# Patient Record
Sex: Male | Born: 1975 | Race: White | Hispanic: No | Marital: Married | Smoking: Former smoker
Health system: Southern US, Community
[De-identification: ages and names within clinical notes are randomized; demographics above are authoritative.]

---

## 2005-02-25 ENCOUNTER — Emergency Department: Payer: Self-pay | Admitting: Internal Medicine

## 2006-02-13 ENCOUNTER — Emergency Department: Payer: Self-pay | Admitting: Emergency Medicine

## 2006-03-18 ENCOUNTER — Emergency Department: Payer: Self-pay | Admitting: Emergency Medicine

## 2008-03-20 ENCOUNTER — Emergency Department: Payer: Self-pay | Admitting: Emergency Medicine

## 2010-03-13 ENCOUNTER — Emergency Department: Payer: Self-pay | Admitting: Emergency Medicine

## 2012-08-31 ENCOUNTER — Emergency Department: Payer: Self-pay | Admitting: Emergency Medicine

## 2012-08-31 LAB — CBC
HCT: 44.8 % (ref 40.0–52.0)
HGB: 15.7 g/dL (ref 13.0–18.0)
MCH: 31.5 pg (ref 26.0–34.0)
MCHC: 35 g/dL (ref 32.0–36.0)
MCV: 90 fL (ref 80–100)
Platelet: 252 10*3/uL (ref 150–440)
WBC: 11.8 10*3/uL — ABNORMAL HIGH (ref 3.8–10.6)

## 2012-08-31 LAB — BASIC METABOLIC PANEL
Anion Gap: 5 — ABNORMAL LOW (ref 7–16)
BUN: 11 mg/dL (ref 7–18)
Chloride: 108 mmol/L — ABNORMAL HIGH (ref 98–107)
Co2: 26 mmol/L (ref 21–32)
Creatinine: 0.85 mg/dL (ref 0.60–1.30)
EGFR (African American): >60
Glucose: 104 mg/dL — ABNORMAL HIGH (ref 65–99)
Osmolality: 277 (ref 275–301)

## 2012-08-31 LAB — TROPONIN I: Troponin-I: <0.02 ng/mL

## 2015-08-28 ENCOUNTER — Emergency Department
Admission: EM | Admit: 2015-08-28 | Discharge: 2015-08-28 | Disposition: A | Discharge status: Home / Self Care | Payer: Self-pay | Attending: Emergency Medicine | Admitting: Emergency Medicine

## 2015-08-28 ENCOUNTER — Encounter: Payer: Self-pay | Admitting: Emergency Medicine

## 2015-08-28 ENCOUNTER — Emergency Department: Payer: Self-pay

## 2015-08-28 DIAGNOSIS — M25572 Pain in left ankle and joints of left foot: Secondary | ICD-10-CM | POA: Insufficient documentation

## 2015-08-28 DIAGNOSIS — M79672 Pain in left foot: Secondary | ICD-10-CM

## 2015-08-28 LAB — SEDIMENTATION RATE: SED RATE: 8 mm/h (ref 0–15)

## 2015-08-28 LAB — URIC ACID: Uric Acid, Serum: 4.9 mg/dL (ref 4.4–7.6)

## 2015-08-28 MED ORDER — TRAMADOL HCL 50 MG PO TABS: 50.0000 mg | ORAL_TABLET | Freq: Four times a day (QID) | ORAL | Status: AC | PRN

## 2015-08-28 MED ORDER — NAPROXEN 500 MG PO TABS: 500.0000 mg | ORAL_TABLET | Freq: Two times a day (BID) | ORAL | Status: DC

## 2015-08-28 MED ORDER — NAPROXEN 500 MG PO TABS
500.0000 mg | ORAL_TABLET | Freq: Once | ORAL | Status: AC
  Administered 2015-08-28: 500 mg via ORAL
  Filled 2015-08-28: qty 1

## 2015-08-28 MED ORDER — TRAMADOL HCL 50 MG PO TABS
50.0000 mg | ORAL_TABLET | Freq: Once | ORAL | Status: AC
  Administered 2015-08-28: 50 mg via ORAL
  Filled 2015-08-28: qty 1

## 2015-08-28 NOTE — ED Notes (Signed)
States he developed pain to left ankle about 1 month ago  Denies any injury  PA in room on arrival

## 2015-08-28 NOTE — ED Notes (Signed)
Patient presents to the ED with left ankle pain x 1 month.  Patient is in no obvious distress at this time.

## 2015-08-28 NOTE — Discharge Instructions (Signed)

## 2015-08-28 NOTE — ED Provider Notes (Signed)
[facility] Emergency Department Provider Note   ____________________________________________  Time seen: Approximately 5:23 PM  I have reviewed the triage vital signs and the nursing notes.   HISTORY  Chief Complaint Ankle Pain    HPI Leonard Clarke is a 40 y.o. male patient complaining one half months of left foot pain that radiates to the lateral ankle. Patient denies any provocative incident except for prolonged standing and walking required by his job. Patient state the pain is increased and is not relieved with palliative measures of anti-inflammatory medications. Patient is rating the pain as a 10 over 10. Patient described a pain as a constant "achy".   History reviewed. No pertinent past medical history.  There are no active problems to display for this patient.   History reviewed. No pertinent past surgical history.  Current Outpatient Rx  Name  Route  Sig  Dispense  Refill  . naproxen (NAPROSYN) 500 MG tablet   Oral   Take 1 tablet (500 mg total) by mouth 2 (two) times daily with a meal.   20 tablet   0   . traMADol (ULTRAM) 50 MG tablet   Oral   Take 1 tablet (50 mg total) by mouth every 6 (six) hours as needed.   20 tablet   0     Allergies Review of patient's allergies indicates no known allergies.  No family history on file.  Social History Social History  Substance Use Topics  . Smoking status: Unknown If Ever Smoked  . Smokeless tobacco: None  . Alcohol Use: No    Review of Systems Constitutional: No fever/chills Eyes: No visual changes. ENT: No sore throat. Cardiovascular: Denies chest pain. Respiratory: Denies shortness of breath. Gastrointestinal: No abdominal pain.  No nausea, no vomiting.  No diarrhea.  No constipation. Genitourinary: Negative for dysuria. Musculoskeletal: Left foot and ankle pain  Skin: Negative for rash. Neurological: Negative for headaches, focal weakness or  numbness.   ____________________________________________   PHYSICAL EXAM:  VITAL SIGNS: ED Triage Vitals  Enc Vitals Group     BP 08/28/15 1648 117/84 mmHg     Pulse Rate 08/28/15 1648 86     Resp 08/28/15 1648 18     Temp 08/28/15 1648 98.1 F (36.7 C)     Temp Source 08/28/15 1648 Oral     SpO2 08/28/15 1648 96 %     Weight 08/28/15 1648 250 lb (113.399 kg)     Height 08/28/15 1648  (1.854 m)     Head Cir --      Peak Flow --      Pain Score 08/28/15 1648 10     Pain Loc --      Pain Edu? --      Excl. in GC? --     Constitutional: Alert and oriented. Well appearing and in no acute distress. Eyes: Conjunctivae are normal. PERRL. EOMI. Head: Atraumatic. Nose: No congestion/rhinnorhea. Mouth/Throat: Mucous membranes are moist.  Oropharynx non-erythematous. Neck: No stridor.  No cervical spine tenderness to palpation. Hematological/Lymphatic/Immunilogical: No cervical lymphadenopathy. Cardiovascular: Normal rate, regular rhythm. Grossly normal heart sounds.  Good peripheral circulation. Respiratory: Normal respiratory effort.  No retractions. Lungs CTAB. Gastrointestinal: Soft and nontender. No distention. No abdominal bruits. No CVA tenderness. Musculoskeletal: Obvious deformity, edema, or erythema to the left foot ankle. Patient has some moderate guarding palpation dose aspect of the left foot and leg lateral aspect of the ankle. Neurologic:  Normal speech and language. No gross focal  neurologic deficits are appreciated. No gait instability. Skin:  Skin is warm, dry and intact. No rash noted. Psychiatric: Mood and affect are normal. Speech and behavior are normal.  ____________________________________________   LABS (all labs ordered are listed, but only abnormal results are displayed)  Labs Reviewed  SEDIMENTATION RATE  URIC ACID   ____________________________________________  EKG   ____________________________________________  RADIOLOGY  No acute  findings on x-ray of the left ankle. ____________________________________________   PROCEDURES  Procedure(s) performed: None  Critical Care performed: No  ____________________________________________   INITIAL IMPRESSION / ASSESSMENT AND PLAN / ED COURSE  Pertinent labs & imaging results that were available during my care of the patient were reviewed by me and considered in my medical decision making (see chart for details).  Left foot and ankle pain. Discussed negative x-ray finding with patient. Discussed negative levers also patient. Patient was given a trial of naproxen and tramadol. Patient advised follow-up with podiatry if no improvement in 3-5 days. ____________________________________________   FINAL CLINICAL IMPRESSION(S) / ED DIAGNOSES  Final diagnoses:  Pain in left foot  Left lateral ankle pain      NEW MEDICATIONS STARTED DURING THIS VISIT:  New Prescriptions   NAPROXEN (NAPROSYN) 500 MG TABLET    Take 1 tablet (500 mg total) by mouth 2 (two) times daily with a meal.   TRAMADOL (ULTRAM) 50 MG TABLET    Take 1 tablet (50 mg total) by mouth every 6 (six) hours as needed.     Note:  This document was prepared using Dragon voice recognition software and may include unintentional dictation errors.    Joni ReiningRonald K Smith, PA-C 08/28/15 1933  Sharyn CreamerMark Quale, MD 08/29/15 [phone]

## 2017-03-11 IMAGING — DX DG FOOT COMPLETE 3+V*L*
3 series · 3 of 3 positions shown · non-contrast
Comparison: None.

CLINICAL DATA: Left ankle pain x1 month

EXAM:
LEFT FOOT - COMPLETE 3+ VIEW

[foot ap]
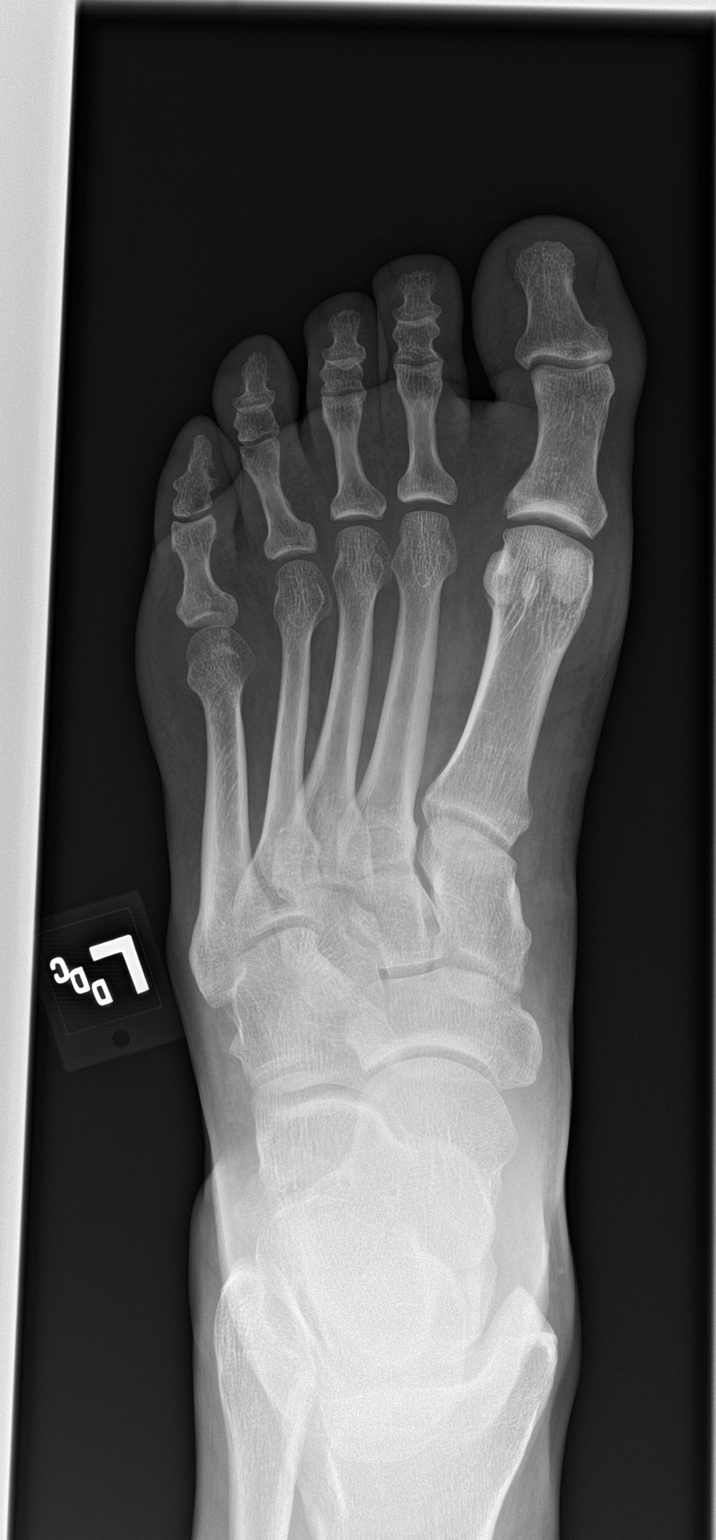

[foot obl]
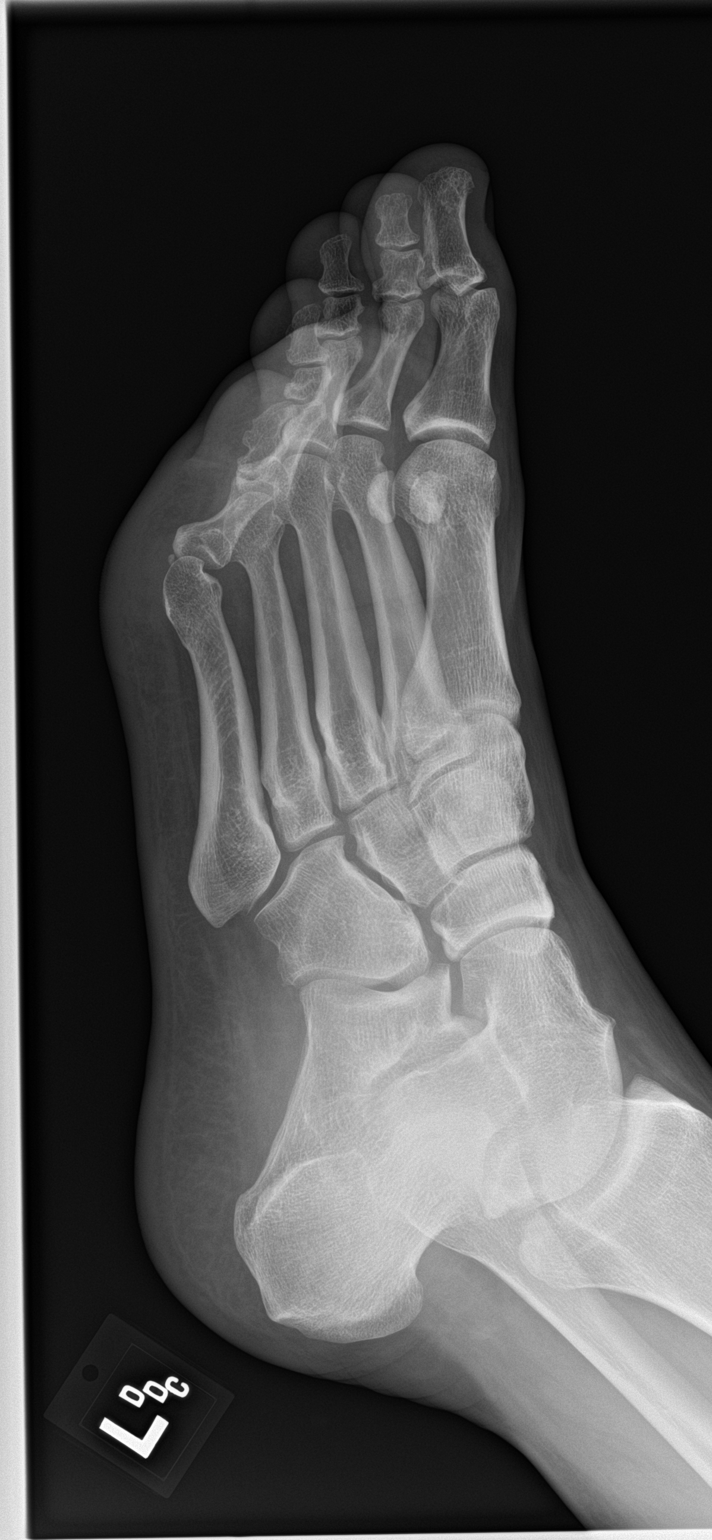

[foot lat]
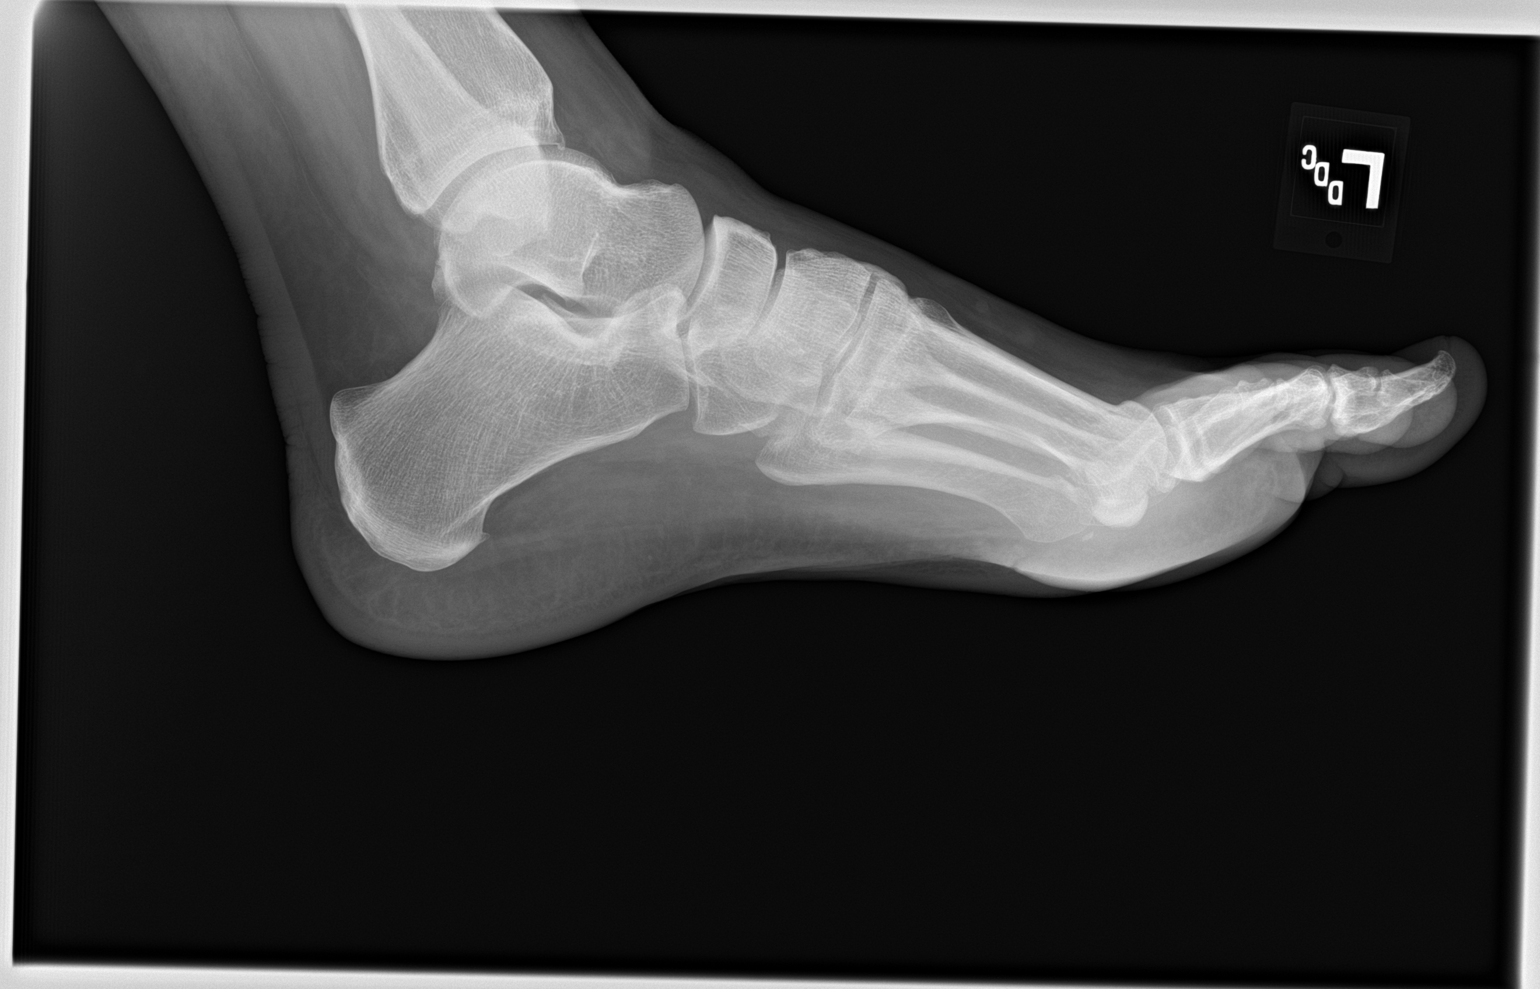

[3 of 3 positions shown; findings below may reference images not displayed]

FINDINGS: No fracture or dislocation is seen.

The joint spaces are preserved.

The visualized soft tissues are unremarkable.
IMPRESSION: No fracture or dislocation is seen.

## 2021-07-02 ENCOUNTER — Ambulatory Visit: Payer: Self-pay | Admitting: Internal Medicine

## 2021-07-02 ENCOUNTER — Encounter: Payer: Self-pay | Admitting: Internal Medicine

## 2021-07-02 VITALS — BP 118/62 | HR 93 | Temp 97.5°F | Resp 16 | Ht 73.0 in | Wt 234.6 lb

## 2021-07-02 DIAGNOSIS — F419 Anxiety disorder, unspecified: Secondary | ICD-10-CM

## 2021-07-02 DIAGNOSIS — Z1159 Encounter for screening for other viral diseases: Secondary | ICD-10-CM

## 2021-07-02 DIAGNOSIS — R911 Solitary pulmonary nodule: Secondary | ICD-10-CM

## 2021-07-02 DIAGNOSIS — Z114 Encounter for screening for human immunodeficiency virus [HIV]: Secondary | ICD-10-CM

## 2021-07-02 DIAGNOSIS — G8929 Other chronic pain: Secondary | ICD-10-CM

## 2021-07-02 DIAGNOSIS — Z13220 Encounter for screening for lipoid disorders: Secondary | ICD-10-CM

## 2021-07-02 DIAGNOSIS — R252 Cramp and spasm: Secondary | ICD-10-CM

## 2021-07-02 DIAGNOSIS — R200 Anesthesia of skin: Secondary | ICD-10-CM

## 2021-07-02 DIAGNOSIS — Z23 Encounter for immunization: Secondary | ICD-10-CM

## 2021-07-02 DIAGNOSIS — M546 Pain in thoracic spine: Secondary | ICD-10-CM

## 2021-07-02 DIAGNOSIS — R202 Paresthesia of skin: Secondary | ICD-10-CM

## 2021-07-02 MED ORDER — HYDROXYZINE HCL 10 MG PO TABS: 10.0000 mg | ORAL_TABLET | ORAL | 1 refills | Status: AC | PRN

## 2021-07-02 MED ORDER — BUPROPION HCL ER (XL) 150 MG PO TB24: 150.0000 mg | ORAL_TABLET | Freq: Every day | ORAL | 1 refills | Status: AC

## 2021-07-02 NOTE — Patient Instructions (Addendum)
It was great seeing you today! ? ?Plan discussed at today's visit: ?-Blood work ordered today, results will be uploaded to [facility].  ?-Back x-ray today, pending results we may get an MRI ?-Chest CT ordered  ?-Hydroxyzine as needed for anxiety ?-Will start low dose Wellbutrin  ?-Tdap vaccine given today as well  ? ?Follow up in: 6 weeks  ? ?Take care and let us know if you have any questions or concerns prior to your next visit. ? ?Dr. Rosana Berger ? ?

## 2021-07-02 NOTE — Progress Notes (Signed)
? ?New Patient Office Visit ? ?Subjective:  ?Patient ID: Leonard Clarke, male    DOB: [birth year]  Age: 46 y.o. MRN: [id number] ? ?CC:  ?Chief Complaint  ?Patient presents with  ? Establish Care  ? Anxiety  ?  Trouble sleeping, legs jumping/tightning  ? Labs Only  ? ? ?HPI ?Leonard Clarke presents as a new patient. He is also here with his significant other who helps provide history.  He has not seen a medical provider since he was young.  He does not have any diagnosed chronic medical conditions.  He does not take any daily medications.  He does have multiple chronic issues going on today. ? ?Anxiety: ?-Duration:uncontrolled ?-Anxious mood: yes  ?-Excessive worrying: yes ?-Irritability: yes  ?-Depressed mood: yes ?-Current Treatments: Nothing, never has been treated  ?-Counseling: No, is considering therapy through his church ? ? ?  07/02/2021  ? 11:35 AM  ?Depression screen PHQ 2/9  ?Decreased Interest 1  ?Down, Depressed, Hopeless 1  ?PHQ - 2 Score 2  ?Altered sleeping 1  ?Tired, decreased energy 1  ?Change in appetite 0  ?Feeling bad or failure about yourself  1  ?Trouble concentrating 3  ?Moving slowly or fidgety/restless 3  ?Suicidal thoughts 0  ?PHQ-9 Score 11  ?Difficult doing work/chores Very difficult  ? ?Muscle cramps: Describes muscle "tightening"/cramps or spasms of his bilateral lower extremity at night. This does not occur during the day. He also has cramps in his hands and wrists, which will turn inward and become very painful. This has been going on for years and is gradually getting worse. He has started a multivitamin which does help. He has noticed this occurring more if he is more active during the day. He does experience numbness and swelling of his feet and hands. He does endorse a red/purple discoloration on the back of his arms occasionally. This will come and go and is without specific triggers. ? ?Thoracic Back Pain: Chronic, at the level of T6-T7 more so on the right side. Worse with  palpation, twisting and certain positions.  Nothing appears to make this pain better.  He does have sciatica symptoms that starts at the level of T6-T7 and shoot down into his lumbar back and into his legs. He denies bladder/bowel incontinence or saddle anesthesia.  ? ?Right upper lobe lung nodule: Went to the ER in [facility] in March for chest pain.  Lung x-ray showed 1 cm nodule in the right upper lobe.  He is a current smoker and smokes about half a pack per day for the last 30 years.  He currently denies respiratory symptoms, no cough, wheezing, shortness of breath, hemoptysis. ? ?History reviewed. No pertinent past medical history. ? ?History reviewed. No pertinent surgical history. ? ?Family History  ?Problem Relation Age of Onset  ? Drug abuse Mother   ? Drug abuse Father   ? Suicidality Son   ? Huntington's disease Maternal Grandmother   ? ? ?Social History  ? ?Socioeconomic History  ? Marital status: Married  ?  Spouse name: Not on file  ? Number of children: Not on file  ? Years of education: Not on file  ? Highest education level: Not on file  ?Occupational History  ? Not on file  ?Tobacco Use  ? Smoking status: Every Day  ?  Packs/day: 0.50  ?  Types: Cigarettes  ? Smokeless tobacco: Not on file  ?Vaping Use  ? Vaping Use: Never used  ?Substance and Sexual Activity  ?  Alcohol use: No  ? Drug use: Never  ? Sexual activity: Yes  ?Other Topics Concern  ? Not on file  ?Social History Narrative  ? Not on file  ? ?Social Determinants of Health  ? ?Financial Resource Strain: Not on file  ?Food Insecurity: Not on file  ?Transportation Needs: Not on file  ?Physical Activity: Not on file  ?Stress: Not on file  ?Social Connections: Not on file  ?Intimate Partner Violence: Not on file  ? ? ?ROS ?Review of Systems  ?Constitutional:  Negative for chills and fever.  ?Respiratory:  Negative for cough, shortness of breath and wheezing.   ?Cardiovascular:  Negative for chest pain.  ?Gastrointestinal:  Negative for  abdominal pain, constipation, diarrhea and nausea.  ?Genitourinary:  Negative for dysuria and hematuria.  ?Musculoskeletal:  Positive for back pain.  ?Skin:  Positive for color change.  ?Neurological:  Positive for numbness. Negative for dizziness, seizures, syncope, weakness and headaches.  ?Psychiatric/Behavioral:  Positive for decreased concentration. The patient is nervous/anxious.   ? ?Objective:  ? ?Today's Vitals: BP 118/62   Pulse 93   Temp (!) 97.5 ?F (36.4 ?C)   Resp 16   Ht 6' 1"  (1.854 m)   Wt 234 lb 9.6 oz (106.4 kg)   SpO2 98%   BMI 30.95 kg/m?  ? ?Physical Exam ?Constitutional:   ?   Appearance: Normal appearance.  ?HENT:  ?   Head: Normocephalic and atraumatic.  ?   Mouth/Throat:  ?   Mouth: Mucous membranes are moist.  ?   Pharynx: Oropharynx is clear.  ?Eyes:  ?   Extraocular Movements: Extraocular movements intact.  ?   Conjunctiva/sclera: Conjunctivae normal.  ?   Pupils: Pupils are equal, round, and reactive to light.  ?Cardiovascular:  ?   Rate and Rhythm: Normal rate and regular rhythm.  ?Pulmonary:  ?   Effort: Pulmonary effort is normal.  ?   Breath sounds: Normal breath sounds. No wheezing, rhonchi or rales.  ?Abdominal:  ?   General: There is no distension.  ?   Palpations: Abdomen is soft.  ?   Tenderness: There is no abdominal tenderness. There is no right CVA tenderness, left CVA tenderness, guarding or rebound.  ?Musculoskeletal:  ?   Right lower leg: No edema.  ?   Left lower leg: No edema.  ?Skin: ?   General: Skin is warm and dry.  ?Neurological:  ?   General: No focal deficit present.  ?   Mental Status: He is alert. Mental status is at baseline.  ?Psychiatric:     ?   Mood and Affect: Mood normal.     ?   Behavior: Behavior normal.  ? ? ?Assessment & Plan:  ? ?1. Anxiety: Discussed counseling, which he will be doing through his church. Worried about sexual effects, will start treatment with Wellbutrin 150 mg daily and hydroxyzine as needed for anxiety.  Discussed potential  side effects of these medications.  Plan to recheck in 6 weeks. ? ?- buPROPion (WELLBUTRIN XL) 150 MG 24 hr tablet; Take 1 tablet (150 mg total) by mouth daily.  Dispense: 30 tablet; Refill: 1 ?- hydrOXYzine (ATARAX) 10 MG tablet; Take 1 tablet (10 mg total) by mouth as needed for anxiety.  Dispense: 30 tablet; Refill: 1 ? ?2. Muscle cramps/Numbness and tingling in both hands: Uncertain etiology, will obtain screening labs including autoimmune and inflammatory screening.  The patient does have a family history of Huntington's disease on his maternal grandmother side.  Discussed pathology of Huntington's disease and prognosis.  At this point the patient does not want to pursue genetic testing or further work-up for this. ? ?- CBC w/Diff/Platelet ?- COMPLETE METABOLIC PANEL WITH GFR ?- Antinuclear Antib (ANA) ?- Sed Rate (ESR) ?- C-reactive protein ? ?3. Chronic right-sided thoracic back pain: We will start work-up with a thoracic x-ray today, however may require MRI. ? ?- DG Thoracic Spine 2 View; Future ? ?4. Solitary pulmonary nodule/Lung nodule seen on imaging study: Chest x-ray from 05/28/2021 showing right upper lobe ill-defined 1 cm nodule ? ?- CT Chest Wo Contrast; Future ? ?5. Screening for HIV without presence of risk factors ? ?- HIV antibody (with reflex) ? ?6. Encounter for hepatitis C screening test for low risk patient ? ?- Hepatitis C Antibody ? ?7. Lipid screening ? ?- Lipid Profile ? ?8. Need for Tdap vaccination ? ?- Tdap vaccine greater than or equal to 7yo IM ? ? ?Follow-up: Return in about 6 weeks (around 08/13/2021).  ? ?Teodora Medici, DO ? ?

## 2021-07-05 LAB — LIPID PANEL
Cholesterol: 138 mg/dL (ref <200)
HDL: 39 mg/dL — ABNORMAL LOW (ref >=40)
LDL Cholesterol (Calc): 82 mg/dL (calc)
Non-HDL Cholesterol (Calc): 99 mg/dL (calc) (ref <130)
Total CHOL/HDL Ratio: 3.5 (calc) (ref <5.0)
Triglycerides: 88 mg/dL (ref <150)

## 2021-07-05 LAB — HEPATITIS C ANTIBODY
Hepatitis C Ab: NONREACTIVE
SIGNAL TO CUT-OFF: 0.07 (ref <1.00)

## 2021-07-05 LAB — CBC WITH DIFFERENTIAL/PLATELET
Absolute Monocytes: 783 cells/uL (ref 200–950)
Basophils Absolute: 52 cells/uL (ref 0–200)
Basophils Relative: 0.6 %
Eosinophils Absolute: 200 cells/uL (ref 15–500)
Eosinophils Relative: 2.3 %
HCT: 40.5 % (ref 38.5–50.0)
Hemoglobin: 13.5 g/dL (ref 13.2–17.1)
Lymphs Abs: 2419 cells/uL (ref 850–3900)
MCH: 29.8 pg (ref 27.0–33.0)
MCHC: 33.3 g/dL (ref 32.0–36.0)
MCV: 89.4 fL (ref 80.0–100.0)
MPV: 10.1 fL (ref 7.5–12.5)
Monocytes Relative: 9 %
Neutro Abs: 5246 cells/uL (ref 1500–7800)
Neutrophils Relative %: 60.3 %
Platelets: 302 10*3/uL (ref 140–400)
RBC: 4.53 10*6/uL (ref 4.20–5.80)
RDW: 13.4 % (ref 11.0–15.0)
Total Lymphocyte: 27.8 %
WBC: 8.7 10*3/uL (ref 3.8–10.8)

## 2021-07-05 LAB — COMPLETE METABOLIC PANEL WITH GFR
AG Ratio: 1.2 (calc) (ref 1.0–2.5)
ALT: 21 U/L (ref 9–46)
AST: 17 U/L (ref 10–40)
Albumin: 3.7 g/dL (ref 3.6–5.1)
Alkaline phosphatase (APISO): 77 U/L (ref 36–130)
BUN: 15 mg/dL (ref 7–25)
CO2: 26 mmol/L (ref 20–32)
Calcium: 8.9 mg/dL (ref 8.6–10.3)
Chloride: 107 mmol/L (ref 98–110)
Creat: 0.87 mg/dL (ref 0.60–1.29)
Globulin: 3 g/dL (calc) (ref 1.9–3.7)
Glucose, Bld: 89 mg/dL (ref 65–99)
Potassium: 5 mmol/L (ref 3.5–5.3)
Sodium: 140 mmol/L (ref 135–146)
Total Bilirubin: 0.3 mg/dL (ref 0.2–1.2)
Total Protein: 6.7 g/dL (ref 6.1–8.1)
eGFR: 110 mL/min/{1.73_m2} (ref >=60)

## 2021-07-05 LAB — HIV ANTIBODY (ROUTINE TESTING W REFLEX): HIV 1&2 Ab, 4th Generation: NONREACTIVE

## 2021-07-05 LAB — ANA: Anti Nuclear Antibody (ANA): NEGATIVE

## 2021-07-05 LAB — SEDIMENTATION RATE: Sed Rate: 9 mm/h (ref 0–15)

## 2021-07-05 LAB — C-REACTIVE PROTEIN: CRP: 6 mg/L (ref <8.0)

## 2021-07-10 ENCOUNTER — Ambulatory Visit: Payer: Self-pay | Attending: Internal Medicine

## 2021-08-15 ENCOUNTER — Ambulatory Visit: Payer: Medicaid Other | Admitting: Internal Medicine

## 2021-08-15 NOTE — Progress Notes (Deleted)
New Patient Office Visit  Subjective:  Patient ID: Leonard Clarke, male    DOB: 01-Jun-1975  Age: 46 y.o. MRN: [id number]  CC:  No chief complaint on file.   HPI Leonard Clarke presents for follow up.   Anxiety: -Duration:uncontrolled -Anxious mood: yes  -Excessive worrying: yes -Irritability: yes  -Depressed mood: yes -Current Treatments: At LOV started Wellbutrin 150 mg daily, Hydroxyzine PRN -Counseling: No, is considering therapy through his church     07/02/2021   11:35 AM  Depression screen PHQ 2/9  Decreased Interest 1  Down, Depressed, Hopeless 1  PHQ - 2 Score 2  Altered sleeping 1  Tired, decreased energy 1  Change in appetite 0  Feeling bad or failure about yourself  1  Trouble concentrating 3  Moving slowly or fidgety/restless 3  Suicidal thoughts 0  PHQ-9 Score 11  Difficult doing work/chores Very difficult   Muscle cramps: Describes muscle "tightening"/cramps or spasms of his bilateral lower extremity at night. This does not occur during the day. He also has cramps in his hands and wrists, which will turn inward and become very painful. This has been going on for years and is gradually getting worse. He has started a multivitamin which does help. He has noticed this occurring more if he is more active during the day. He does experience numbness and swelling of his feet and hands. He does endorse a red/purple discoloration on the back of his arms occasionally. This will come and go and is without specific triggers.  Thoracic Back Pain: Chronic, at the level of T6-T7 more so on the right side. Worse with palpation, twisting and certain positions.  Nothing appears to make this pain better.  He does have sciatica symptoms that starts at the level of T6-T7 and shoot down into his lumbar back and into his legs. He denies bladder/bowel incontinence or saddle anesthesia.   Ordered x-ray of the thoracic spine which was never obtained.   Right upper lobe lung nodule:  Went to the ER in [facility] in March for chest pain.  Lung x-ray showed 1 cm nodule in the right upper lobe.  He is a current smoker and smokes about half a pack per day for the last 30 years.  He currently denies respiratory symptoms, no cough, wheezing, shortness of breath, hemoptysis.  CT chest w/o contrast ordered, not done.   No past medical history on file.  No past surgical history on file.  Family History  Problem Relation Age of Onset   Drug abuse Mother    Drug abuse Father    Suicidality Son    Huntington's disease Maternal Grandmother     Social History   Socioeconomic History   Marital status: Married    Spouse name: Not on file   Number of children: Not on file   Years of education: Not on file   Highest education level: Not on file  Occupational History   Not on file  Tobacco Use   Smoking status: Every Day    Packs/day: 0.50    Types: Cigarettes   Smokeless tobacco: Not on file  Vaping Use   Vaping Use: Never used  Substance and Sexual Activity   Alcohol use: No   Drug use: Never   Sexual activity: Yes  Other Topics Concern   Not on file  Social History Narrative   Not on file   Social Determinants of Health   Financial Resource Strain: Not on file  Food Insecurity:  Not on file  Transportation Needs: Not on file  Physical Activity: Not on file  Stress: Not on file  Social Connections: Not on file  Intimate Partner Violence: Not on file    ROS Review of Systems  Constitutional:  Negative for chills and fever.  Respiratory:  Negative for cough, shortness of breath and wheezing.   Cardiovascular:  Negative for chest pain.  Gastrointestinal:  Negative for abdominal pain, constipation, diarrhea and nausea.  Genitourinary:  Negative for dysuria and hematuria.  Musculoskeletal:  Positive for back pain.  Skin:  Positive for color change.  Neurological:  Positive for numbness. Negative for dizziness, seizures, syncope, weakness and headaches.   Psychiatric/Behavioral:  Positive for decreased concentration. The patient is nervous/anxious.    Objective:   Today's Vitals: There were no vitals taken for this visit.  Physical Exam Constitutional:      Appearance: Normal appearance.  HENT:     Head: Normocephalic and atraumatic.     Mouth/Throat:     Mouth: Mucous membranes are moist.     Pharynx: Oropharynx is clear.  Eyes:     Extraocular Movements: Extraocular movements intact.     Conjunctiva/sclera: Conjunctivae normal.     Pupils: Pupils are equal, round, and reactive to light.  Cardiovascular:     Rate and Rhythm: Normal rate and regular rhythm.  Pulmonary:     Effort: Pulmonary effort is normal.     Breath sounds: Normal breath sounds. No wheezing, rhonchi or rales.  Abdominal:     General: There is no distension.     Palpations: Abdomen is soft.     Tenderness: There is no abdominal tenderness. There is no right CVA tenderness, left CVA tenderness, guarding or rebound.  Musculoskeletal:     Right lower leg: No edema.     Left lower leg: No edema.  Skin:    General: Skin is warm and dry.  Neurological:     General: No focal deficit present.     Mental Status: He is alert. Mental status is at baseline.  Psychiatric:        Mood and Affect: Mood normal.        Behavior: Behavior normal.    Assessment & Plan:   1. Anxiety: Discussed counseling, which he will be doing through his church. Worried about sexual effects, will start treatment with Wellbutrin 150 mg daily and hydroxyzine as needed for anxiety.  Discussed potential side effects of these medications.  Plan to recheck in 6 weeks.  - buPROPion (WELLBUTRIN XL) 150 MG 24 hr tablet; Take 1 tablet (150 mg total) by mouth daily.  Dispense: 30 tablet; Refill: 1 - hydrOXYzine (ATARAX) 10 MG tablet; Take 1 tablet (10 mg total) by mouth as needed for anxiety.  Dispense: 30 tablet; Refill: 1  2. Muscle cramps/Numbness and tingling in both hands: Uncertain  etiology, will obtain screening labs including autoimmune and inflammatory screening.  The patient does have a family history of Huntington's disease on his maternal grandmother side.  Discussed pathology of Huntington's disease and prognosis.  At this point the patient does not want to pursue genetic testing or further work-up for this.  - CBC w/Diff/Platelet - COMPLETE METABOLIC PANEL WITH GFR - Antinuclear Antib (ANA) - Sed Rate (ESR) - C-reactive protein  3. Chronic right-sided thoracic back pain: We will start work-up with a thoracic x-ray today, however may require MRI.  - DG Thoracic Spine 2 View; Future  4. Solitary pulmonary nodule/Lung nodule seen on imaging  study: Chest x-ray from 05/28/2021 showing right upper lobe ill-defined 1 cm nodule  - CT Chest Wo Contrast; Future  5. Screening for HIV without presence of risk factors  - HIV antibody (with reflex)  6. Encounter for hepatitis C screening test for low risk patient  - Hepatitis C Antibody  7. Lipid screening  - Lipid Profile  8. Need for Tdap vaccination  - Tdap vaccine greater than or equal to 7yo IM   Follow-up: No follow-ups on file.   Teodora Medici, DO

## 2022-03-28 ENCOUNTER — Emergency Department (HOSPITAL_COMMUNITY): Payer: Medicaid Other

## 2022-03-28 ENCOUNTER — Emergency Department (HOSPITAL_COMMUNITY)
Admission: EM | Admit: 2022-03-28 | Discharge: 2022-03-28 | Disposition: A | Discharge status: Home / Self Care | Payer: Medicaid Other | Attending: Emergency Medicine | Admitting: Emergency Medicine

## 2022-03-28 ENCOUNTER — Encounter (HOSPITAL_COMMUNITY): Payer: Self-pay

## 2022-03-28 DIAGNOSIS — U071 COVID-19: Secondary | ICD-10-CM | POA: Insufficient documentation

## 2022-03-28 DIAGNOSIS — R0602 Shortness of breath: Secondary | ICD-10-CM | POA: Diagnosis not present

## 2022-03-28 DIAGNOSIS — R079 Chest pain, unspecified: Secondary | ICD-10-CM | POA: Diagnosis not present

## 2022-03-28 DIAGNOSIS — R059 Cough, unspecified: Secondary | ICD-10-CM | POA: Diagnosis not present

## 2022-03-28 DIAGNOSIS — J189 Pneumonia, unspecified organism: Secondary | ICD-10-CM

## 2022-03-28 DIAGNOSIS — R0789 Other chest pain: Secondary | ICD-10-CM | POA: Diagnosis not present

## 2022-03-28 DIAGNOSIS — J1282 Pneumonia due to coronavirus disease 2019: Secondary | ICD-10-CM | POA: Diagnosis not present

## 2022-03-28 DIAGNOSIS — J168 Pneumonia due to other specified infectious organisms: Secondary | ICD-10-CM | POA: Insufficient documentation

## 2022-03-28 DIAGNOSIS — M940 Chondrocostal junction syndrome [Tietze]: Secondary | ICD-10-CM | POA: Diagnosis not present

## 2022-03-28 DIAGNOSIS — R509 Fever, unspecified: Secondary | ICD-10-CM | POA: Diagnosis not present

## 2022-03-28 DIAGNOSIS — R000 Tachycardia, unspecified: Secondary | ICD-10-CM | POA: Insufficient documentation

## 2022-03-28 DIAGNOSIS — G4489 Other headache syndrome: Secondary | ICD-10-CM | POA: Diagnosis not present

## 2022-03-28 MED ORDER — AZITHROMYCIN 250 MG PO TABS: 250.0000 mg | ORAL_TABLET | Freq: Every day | ORAL | 0 refills | Status: AC

## 2022-03-28 MED ORDER — LIDOCAINE 4 % EX PTCH: 1.0000 | MEDICATED_PATCH | CUTANEOUS | 0 refills | Status: DC

## 2022-03-28 MED ORDER — LIDOCAINE 4 % EX PTCH: 1.0000 | MEDICATED_PATCH | CUTANEOUS | 0 refills | Status: AC

## 2022-03-28 MED ORDER — ALBUTEROL SULFATE HFA 108 (90 BASE) MCG/ACT IN AERS: 1.0000 | INHALATION_SPRAY | Freq: Four times a day (QID) | RESPIRATORY_TRACT | 0 refills | Status: AC | PRN

## 2022-03-28 MED ORDER — AZITHROMYCIN 250 MG PO TABS: 250.0000 mg | ORAL_TABLET | Freq: Every day | ORAL | 0 refills | Status: DC

## 2022-03-28 MED ORDER — AMOXICILLIN 500 MG PO CAPS: 1000.0000 mg | ORAL_CAPSULE | Freq: Three times a day (TID) | ORAL | 0 refills | Status: AC

## 2022-03-28 MED ORDER — IBUPROFEN 800 MG PO TABS
800.0000 mg | ORAL_TABLET | Freq: Once | ORAL | Status: AC
  Administered 2022-03-28: 800 mg via ORAL
  Filled 2022-03-28: qty 1

## 2022-03-28 MED ORDER — AMOXICILLIN 500 MG PO CAPS: 1000.0000 mg | ORAL_CAPSULE | Freq: Three times a day (TID) | ORAL | 0 refills | Status: DC

## 2022-03-28 MED ORDER — ALBUTEROL SULFATE HFA 108 (90 BASE) MCG/ACT IN AERS: 1.0000 | INHALATION_SPRAY | Freq: Four times a day (QID) | RESPIRATORY_TRACT | 0 refills | Status: DC | PRN

## 2022-03-28 NOTE — Discharge Instructions (Signed)
Please follow-up with your PCP. Return to the ED if you have severe chest pain, oxygen saturations below 90% or feel like your condition is worsening.

## 2022-03-28 NOTE — ED Triage Notes (Signed)
Pt arrives via [facility] from home for URI symptoms including malaise, chills, cough, congestion for five days. Pt reports taking a home COVID test today and was positive. Pt took 1000 mg tylenol PTA.

## 2022-03-28 NOTE — ED Provider Notes (Signed)
[facility] DEPT Provider Note   CSN: [id number] Arrival date & time: 03/28/22  1344     History  Chief Complaint  Patient presents with   URI    Leonard Clarke is a 47 y.o. male, no pertinent past medical history, who presents to the ED secondary to fatigue, shortness of breath, and chest discomfort when coughing, he states he feels just very rundown and has been having sore throat and congestion.  Tested positive for COVID today.  Symptoms have been going on for about 5 days, feels very just down.  Is not having nausea, vomiting, diarrhea.  Last took Tylenol about noon, 1000 mg.  Denies any sick contacts.     Home Medications Prior to Admission medications   Medication Sig Start Date End Date Taking? Authorizing Provider  albuterol (VENTOLIN HFA) 108 (90 Base) MCG/ACT inhaler Inhale 1-2 puffs into the lungs every 6 (six) hours as needed for wheezing or shortness of breath. 03/28/22   Gautham Hewins L, PA  amoxicillin (AMOXIL) 500 MG capsule Take 2 capsules (1,000 mg total) by mouth 3 (three) times daily for 5 days. 03/28/22 04/02/22  Kimbrely Buckel L, PA  azithromycin (ZITHROMAX) 250 MG tablet Take 1 tablet (250 mg total) by mouth daily. Take first 2 tablets together, then 1 every day until finished. 03/28/22   Jandi Swiger L, PA  buPROPion (WELLBUTRIN XL) 150 MG 24 hr tablet Take 1 tablet (150 mg total) by mouth daily. 07/02/21   Teodora Medici, DO  hydrOXYzine (ATARAX) 10 MG tablet Take 1 tablet (10 mg total) by mouth as needed for anxiety. 07/02/21   Teodora Medici, DO  lidocaine (HM LIDOCAINE PATCH) 4 % Place 1 patch onto the skin daily. 03/28/22   Gergory Biello L, PA      Allergies    Patient has no known allergies.    Review of Systems   Review of Systems  Constitutional:  Positive for fever.  HENT:  Positive for sore throat.   Respiratory:  Positive for chest tightness and shortness of breath.   Gastrointestinal:  Negative for abdominal  pain.    Physical Exam Updated Vital Signs BP 123/74 (BP Location: Left Arm)   Pulse (!) 106   Temp (!) 100.4 F (38 C) (Oral)   Resp 18   SpO2 99%  Physical Exam Vitals and nursing note reviewed.  Constitutional:      General: He is not in acute distress.    Appearance: He is well-developed.  HENT:     Head: Normocephalic and atraumatic.  Eyes:     Conjunctiva/sclera: Conjunctivae normal.  Cardiovascular:     Rate and Rhythm: Normal rate and regular rhythm.     Heart sounds: No murmur heard. Pulmonary:     Effort: Pulmonary effort is normal. No respiratory distress.     Breath sounds: Normal breath sounds.  Chest:     Comments: No murmurs or rubs Abdominal:     Palpations: Abdomen is soft.     Tenderness: There is no abdominal tenderness.  Musculoskeletal:        General: No swelling.     Cervical back: Neck supple.     Comments: TTP of R side of chest  Skin:    General: Skin is warm and dry.     Capillary Refill: Capillary refill takes less than 2 seconds.  Neurological:     Mental Status: He is alert.  Psychiatric:        Mood and  Affect: Mood normal.     ED Results / Procedures / Treatments   Labs (all labs ordered are listed, but only abnormal results are displayed) Labs Reviewed - No data to display  EKG None  Radiology DG Chest 2 View  Result Date: 03/28/2022 CLINICAL DATA:  Shortness of breath and chest pain with cough, reportedly tested positive for COVID EXAM: CHEST - 2 VIEW COMPARISON:  Chest radiograph dated 08/31/2012 FINDINGS: Normal lung volumes. Patchy bilateral lower lung opacities. Nodular peripheral right apical opacity. No pleural effusion or pneumothorax. The heart size and mediastinal contours are within normal limits. The visualized skeletal structures are unremarkable. IMPRESSION: 1. Patchy bilateral lower lung opacities, which can be seen in the setting of reported COVID 19 infection. 2. Nodular peripheral right apical opacity, which  may reflect a focus of infection, pulmonary nodule, or superimposition of structures. Recommend repeat chest radiograph in 6-8 weeks once clinical symptoms have resolved to ensure imaging resolution. Electronically Signed   By: Darrin Nipper M.D.   On: 03/28/2022 14:27    Procedures Procedures    Medications Ordered in ED Medications  ibuprofen (ADVIL) tablet 800 mg (800 mg Oral Given 03/28/22 1406)    ED Course/ Medical Decision Making/ A&P                           Medical Decision Making Patient is a 47 year old male, here for fatigue, sore throat, cough, chest pain when coughing, for the last 5 days.  States he is feels like he is getting worse, just feels very rundown.  Last took Tylenol at around noon.  We will obtain a chest x-ray and a EKG to further evaluate.  I am suspicious that his chest discomfort is likely secondary to costochondritis given that he has been frequently coughing, and he has chest wall tenderness.  He is well-appearing, and just looks fatigued, however not ill.  Amount and/or Complexity of Data Reviewed Radiology: ordered.    Details: Chest x-ray shows findings suspicious for COVID-19 which he already has, and then possible focal infection. ECG/medicine tests:     Details: Sinus tachycardia Discussion of management or test interpretation with external provider(s): We will treat with antibiotics, including amoxicillin, azithromycin, and I encouraged and follow-up with his primary care to repeat the chest x-ray in about 6 to 8 weeks to make sure there is resolution of his nodule/opacity.  We discussed return precautions, such as worsening chest pain, worsening shortness of breath, oxygen saturations less than 90%.  He voiced understanding, and will follow-up with his primary care doctor.  Risk OTC drugs. Prescription drug management.    Final Clinical Impression(s) / ED Diagnoses Final diagnoses:  COVID-19  Costochondritis  Pneumonia due to infectious organism,  unspecified laterality, unspecified part of lung    Rx / DC Orders ED Discharge Orders          Ordered    amoxicillin (AMOXIL) 500 MG capsule  3 times daily,   Status:  Discontinued        03/28/22 1505    azithromycin (ZITHROMAX) 250 MG tablet  Daily,   Status:  Discontinued        03/28/22 1505    albuterol (VENTOLIN HFA) 108 (90 Base) MCG/ACT inhaler  Every 6 hours PRN,   Status:  Discontinued        03/28/22 1507    lidocaine (HM LIDOCAINE PATCH) 4 %  Every 24 hours,   Status:  Discontinued  03/28/22 1507    albuterol (VENTOLIN HFA) 108 (90 Base) MCG/ACT inhaler  Every 6 hours PRN        03/28/22 1513    amoxicillin (AMOXIL) 500 MG capsule  3 times daily        03/28/22 1513    azithromycin (ZITHROMAX) 250 MG tablet  Daily        03/28/22 1513    lidocaine (HM LIDOCAINE PATCH) 4 %  Every 24 hours        03/28/22 1513              Jaicob Dia, [place], PA 03/28/22 1521    Wynetta Fines, MD 03/29/22 [phone]

## 2022-03-28 NOTE — ED Provider Triage Note (Signed)
Emergency Medicine Provider Triage Evaluation Note  Leonard Clarke , a 47 y.o. male  was evaluated in triage.  Pt complains of fatigue, SOB, and chest pain when coughing.  States he tested COVID-positive today, and just feels very bad.  Symptoms have been going on for the last 5 days.  No vomiting or diarrhea.  Just feels very rundown.   Review of Systems  Positive: Chest pain when coughing, SOB Negative: V/D  Physical Exam  BP 123/74 (BP Location: Left Arm)   Pulse (!) 106   Temp (!) 100.4 F (38 C) (Oral)   Resp 18   SpO2 99%  Gen:   Awake, no distress   Resp:  Normal effort  MSK:   Moves extremities without difficulty  Other:    Medical Decision Making  Medically screening exam initiated at 1:53 PM.  Appropriate orders placed.  Leonard Clarke was informed that the remainder of the evaluation will be completed by another provider, this initial triage assessment does not replace that evaluation, and the importance of remaining in the ED until their evaluation is complete.    Osvaldo Shipper, [place] 03/28/22 1359

## 2022-07-03 ENCOUNTER — Other Ambulatory Visit: Payer: Self-pay | Admitting: Internal Medicine

## 2022-07-03 DIAGNOSIS — F419 Anxiety disorder, unspecified: Secondary | ICD-10-CM

## 2022-07-04 ENCOUNTER — Other Ambulatory Visit: Payer: Self-pay

## 2022-07-04 DIAGNOSIS — F419 Anxiety disorder, unspecified: Secondary | ICD-10-CM

## 2022-07-04 NOTE — Telephone Encounter (Signed)
Pt called, received recording of "number you dialed is not in service..." pt is due for OV for medication refill. LOV 06/29/21.

## 2022-07-04 NOTE — Telephone Encounter (Signed)
Requested medication (s) are due for refill today: yes  Requested medication (s) are on the active medication list: yes  Last refill:  07/02/21 #30/1  Future visit scheduled: no  Notes to clinic:   pt is due for OV and updated labs for medication refill. LOV 06/29/21.      Requested Prescriptions  Pending Prescriptions Disp Refills   buPROPion (WELLBUTRIN XL) 150 MG 24 hr tablet [Pharmacy Med Name: BUPROPION XL  TABLETS (24 H)] 30 tablet 1    Sig: TAKE 1 TABLET(150 MG) BY MOUTH DAILY     Psychiatry: Antidepressants - bupropion Failed - 07/03/2022  3:38 PM      Failed - Cr in normal range and within 360 days    Creat  Date Value Ref Range Status  07/02/2021 0.87 0.60 - 1.29 mg/dL Final         Failed - AST in normal range and within 360 days    AST  Date Value Ref Range Status  07/02/2021 17 10 - 40 U/L Final         Failed - ALT in normal range and within 360 days    ALT  Date Value Ref Range Status  07/02/2021 21 9 - 46 U/L Final         Failed - Valid encounter within last 6 months    Recent Outpatient Visits           1 year ago Anxiety   [facility] Margarita Mail, DO              Passed - Last BP in normal range    BP Readings from Last 1 Encounters:  03/28/22 116/68

## 2023-04-30 ENCOUNTER — Other Ambulatory Visit: Payer: Self-pay

## 2023-04-30 ENCOUNTER — Emergency Department (HOSPITAL_COMMUNITY)
Admission: EM | Admit: 2023-04-30 | Discharge: 2023-04-30 | Disposition: A | Discharge status: Home / Self Care | Payer: Medicaid Other | Attending: Emergency Medicine | Admitting: Emergency Medicine

## 2023-04-30 DIAGNOSIS — S01511A Laceration without foreign body of lip, initial encounter: Secondary | ICD-10-CM | POA: Insufficient documentation

## 2023-04-30 DIAGNOSIS — Y92830 Public park as the place of occurrence of the external cause: Secondary | ICD-10-CM | POA: Insufficient documentation

## 2023-04-30 DIAGNOSIS — W01198A Fall on same level from slipping, tripping and stumbling with subsequent striking against other object, initial encounter: Secondary | ICD-10-CM | POA: Diagnosis not present

## 2023-04-30 DIAGNOSIS — S0181XA Laceration without foreign body of other part of head, initial encounter: Secondary | ICD-10-CM | POA: Insufficient documentation

## 2023-04-30 DIAGNOSIS — S0993XA Unspecified injury of face, initial encounter: Secondary | ICD-10-CM | POA: Diagnosis present

## 2023-04-30 MED ORDER — LIDOCAINE HCL (PF) 1 % IJ SOLN
5.0000 mL | Freq: Once | INTRAMUSCULAR | Status: AC
  Administered 2023-04-30: 5 mL
  Filled 2023-04-30: qty 30

## 2023-04-30 MED ORDER — LIDOCAINE-EPINEPHRINE-TETRACAINE (LET) TOPICAL GEL
3.0000 mL | Freq: Once | TOPICAL | Status: AC
  Administered 2023-04-30: 3 mL via TOPICAL
  Filled 2023-04-30: qty 3

## 2023-04-30 NOTE — Discharge Instructions (Signed)
Follow-up for suture removal and follow-up in 5- 7 days  Ice to the area.  Expect her lip to swell.  Try to limit eating hard food or any excessive talking or movement of the mouth  Anytime you eat or drink anything other than water, make sure to swish and spit with water to avoid food getting into the laceration  Follow-up outpatient, return for any worsening symptoms

## 2023-04-30 NOTE — ED Provider Notes (Signed)
 [facility] EMERGENCY DEPARTMENT AT [facility] Provider Note   CSN: [id number] Arrival date & time: 04/30/23  1439     History  Chief Complaint  Patient presents with   Facial Laceration    Leonard Clarke is a 48 y.o. male here for evaluation of laceration to upper lip.  He was taking a park table.  He pulled up on the leg of the table and subsequently falling back and hitting his upper lip.  He has been centimeter laceration to the center upper lip.  Last tetanus updated this summer.  He denies any facial pain, headache, loose dentition.  HPI     Home Medications Prior to Admission medications   Medication Sig Start Date End Date Taking? Authorizing Provider  albuterol (VENTOLIN HFA) 108 (90 Base) MCG/ACT inhaler Inhale 1-2 puffs into the lungs every 6 (six) hours as needed for wheezing or shortness of breath. 03/28/22   Small, Brooke L, PA  azithromycin (ZITHROMAX) 250 MG tablet Take 1 tablet (250 mg total) by mouth daily. Take first 2 tablets together, then 1 every day until finished. 03/28/22   Small, Brooke L, PA  buPROPion (WELLBUTRIN XL) 150 MG 24 hr tablet Take 1 tablet (150 mg total) by mouth daily. 07/02/21   Margarita Mail, DO  hydrOXYzine (ATARAX) 10 MG tablet Take 1 tablet (10 mg total) by mouth as needed for anxiety. 07/02/21   Margarita Mail, DO  lidocaine (HM LIDOCAINE PATCH) 4 % Place 1 patch onto the skin daily. 03/28/22   Small, Brooke L, PA      Allergies    Patient has no known allergies.    Review of Systems   Review of Systems  Constitutional: Negative.   HENT: Negative.    Respiratory: Negative.    Cardiovascular: Negative.   Gastrointestinal: Negative.   Genitourinary: Negative.   Musculoskeletal: Negative.   Skin:  Positive for wound.  Neurological: Negative.   All other systems reviewed and are negative.   Physical Exam Updated Vital Signs BP 117/80 (BP Location: Left Arm)   Pulse 78   Temp 97.8 F (36.6 C) (Oral)   Resp  16   Ht 6\' 1"  (1.854 m)   Wt 115.7 kg   SpO2 100%   BMI 33.64 kg/m  Physical Exam Vitals and nursing note reviewed.  Constitutional:      General: He is not in acute distress.    Appearance: He is well-developed. He is not ill-appearing, toxic-appearing or diaphoretic.  HENT:     Head: Normocephalic. Laceration present. No raccoon eyes or Battle's sign.     Jaw: There is normal jaw occlusion.     Comments: No drooling, dysphagia or trismus.  No loose dentition.  No raccoon eye, Battle sign    Mouth/Throat:     Lips: Pink.     Mouth: Mucous membranes are moist.     Pharynx: Oropharynx is clear. Uvula midline.      Comments: 1 cm laceration through vermilion border just left of midline to the left. Eyes:     Pupils: Pupils are equal, round, and reactive to light.  Cardiovascular:     Rate and Rhythm: Normal rate and regular rhythm.  Pulmonary:     Effort: Pulmonary effort is normal. No respiratory distress.  Abdominal:     General: There is no distension.     Palpations: Abdomen is soft.  Musculoskeletal:        General: Normal range of motion.  Cervical back: Normal range of motion and neck supple.  Skin:    General: Skin is warm and dry.  Neurological:     General: No focal deficit present.     Mental Status: He is alert and oriented to person, place, and time.     ED Results / Procedures / Treatments   Labs (all labs ordered are listed, but only abnormal results are displayed) Labs Reviewed - No data to display  EKG None  Radiology No results found.  Procedures .Laceration Repair  Date/Time: 04/30/2023 4:05 PM  Performed by: Ralph Leyden A, PA-C Authorized by: Linwood Dibbles, PA-C   Consent:    Consent obtained:  Verbal   Consent given by:  Patient   Risks, benefits, and alternatives were discussed: yes     Risks discussed:  Infection, pain, retained foreign body, tendon damage, vascular damage, poor cosmetic result, poor wound healing, need  for additional repair and nerve damage   Alternatives discussed:  No treatment, delayed treatment, referral and observation Universal protocol:    Procedure explained and questions answered to patient or proxy's satisfaction: yes     Relevant documents present and verified: yes     Test results available: yes     Imaging studies available: yes     Required blood products, implants, devices, and special equipment available: yes     Site/side marked: yes     Immediately prior to procedure, a time out was called: yes     Patient identity confirmed:  Verbally with patient Anesthesia:    Anesthesia method:  Topical application   Topical anesthetic:  LET Laceration details:    Location:  Lip   Lip location:  Upper lip, full thickness   Vermilion border involved: yes     Height of lip laceration:  Up to half vertical height   Length (cm):  1   Depth (mm):  6 Pre-procedure details:    Preparation:  Patient was prepped and draped in usual sterile fashion Exploration:    Hemostasis achieved with:  LET   Imaging outcome: foreign body not noted     Wound extent: no foreign body, no signs of injury, no tendon damage, no underlying fracture and no vascular damage     Contaminated: no   Treatment:    Area cleansed with:  Soap and water   Amount of cleaning:  Extensive   Irrigation solution:  Sterile saline   Irrigation method:  Pressure wash Skin repair:    Repair method:  Sutures   Suture size:  6-0   Suture material:  Prolene   Suture technique:  Simple interrupted   Number of sutures:  4 Approximation:    Approximation:  Close   Vermilion border well-aligned: yes   Repair type:    Repair type:  Intermediate Post-procedure details:    Dressing:  Open (no dressing)   Procedure completion:  Tolerated well, no immediate complications     Medications Ordered in ED Medications  lidocaine-EPINEPHrine-tetracaine (LET) topical gel (3 mLs Topical Given 04/30/23 1534)  lidocaine (PF)  (XYLOCAINE) 1 % injection 5 mL (5 mLs Infiltration Given by Other 04/30/23 1602)    ED Course/ Medical Decision Making/ A&P   48 year old here for evaluation of laceration to left upper lip.  Occurred after taking a part a table subsequently pulling on the leg of the table and it hitting his upper lip.  He is not drooling, dysphagia or trismus.  No loose teeth.  Nontender facial bones.  His tetanus is up-to-date.  Do not feel any additional labs or imaging at this time.  Will plan to suture.  See procedure note.  Patient tolerated well.  #4 Prolene sutures placed.  Discussed anticipatory guidance and follow-up outpatient.  Return for any worsening symptoms.  The patient has been appropriately medically screened and/or stabilized in the ED. I have low suspicion for any other emergent medical condition which would require further screening, evaluation or treatment in the ED or require inpatient management.  Patient is hemodynamically stable and in no acute distress.  Patient able to ambulate in department prior to ED.  Evaluation does not show acute pathology that would require ongoing or additional emergent interventions while in the emergency department or further inpatient treatment.  I have discussed the diagnosis with the patient and answered all questions.  Pain is been managed while in the emergency department and patient has no further complaints prior to discharge.  Patient is comfortable with plan discussed in room and is stable for discharge at this time.  I have discussed strict return precautions for returning to the emergency department.  Patient was encouraged to follow-up with PCP/specialist refer to at discharge.                                  Medical Decision Making Amount and/or Complexity of Data Reviewed External Data Reviewed: notes.  Risk OTC drugs. Prescription drug management. Decision regarding hospitalization. Diagnosis or treatment significantly limited by social  determinants of health.           Final Clinical Impression(s) / ED Diagnoses Final diagnoses:  Facial laceration, initial encounter    Rx / DC Orders ED Discharge Orders     None         Shanisha Lech A, PA-C 04/30/23 1608    Linwood Dibbles, MD 05/05/23 [phone]

## 2023-04-30 NOTE — ED Triage Notes (Signed)
Pt was taking apart a table when the leg broke and hit him in the lip. 1cm deep lac to upper right lip. Bleeding controlled.

## 2023-10-14 ENCOUNTER — Other Ambulatory Visit: Payer: Self-pay

## 2023-10-14 ENCOUNTER — Encounter (HOSPITAL_COMMUNITY): Payer: Self-pay

## 2023-10-14 ENCOUNTER — Emergency Department (HOSPITAL_COMMUNITY)
Admission: EM | Admit: 2023-10-14 | Discharge: 2023-10-14 | Disposition: A | Discharge status: Home / Self Care | Attending: Emergency Medicine | Admitting: Emergency Medicine

## 2023-10-14 ENCOUNTER — Emergency Department (HOSPITAL_COMMUNITY)

## 2023-10-14 DIAGNOSIS — R911 Solitary pulmonary nodule: Secondary | ICD-10-CM | POA: Diagnosis not present

## 2023-10-14 DIAGNOSIS — J40 Bronchitis, not specified as acute or chronic: Secondary | ICD-10-CM | POA: Diagnosis not present

## 2023-10-14 DIAGNOSIS — R0789 Other chest pain: Secondary | ICD-10-CM | POA: Diagnosis present

## 2023-10-14 LAB — BASIC METABOLIC PANEL WITH GFR
Anion gap: 7 (ref 5–15)
BUN: 17 mg/dL (ref 6–20)
CO2: 24 mmol/L (ref 22–32)
Calcium: 8.8 mg/dL — ABNORMAL LOW (ref 8.9–10.3)
Chloride: 104 mmol/L (ref 98–111)
Creatinine, Ser: 0.86 mg/dL (ref 0.61–1.24)
GFR, Estimated: >60 mL/min (ref >60)
Glucose, Bld: 109 mg/dL — ABNORMAL HIGH (ref 70–99)
Potassium: 4.6 mmol/L (ref 3.5–5.1)
Sodium: 135 mmol/L (ref 135–145)

## 2023-10-14 LAB — CBC
HCT: 42.7 % (ref 39.0–52.0)
Hemoglobin: 14.6 g/dL (ref 13.0–17.0)
MCH: 30.4 pg (ref 26.0–34.0)
MCHC: 34.2 g/dL (ref 30.0–36.0)
MCV: 89 fL (ref 80.0–100.0)
Platelets: 271 K/uL (ref 150–400)
RBC: 4.8 MIL/uL (ref 4.22–5.81)
RDW: 12.7 % (ref 11.5–15.5)
WBC: 8.4 K/uL (ref 4.0–10.5)
nRBC: 0 % (ref 0.0–0.2)

## 2023-10-14 LAB — TROPONIN I (HIGH SENSITIVITY)
Troponin I (High Sensitivity): <2 ng/L (ref <18)
Troponin I (High Sensitivity): <2 ng/L (ref <18)

## 2023-10-14 MED ORDER — ACETAMINOPHEN 325 MG PO TABS
650.0000 mg | ORAL_TABLET | Freq: Once | ORAL | Status: AC
  Administered 2023-10-14: 650 mg via ORAL
  Filled 2023-10-14: qty 2

## 2023-10-14 MED ORDER — ALBUTEROL SULFATE HFA 108 (90 BASE) MCG/ACT IN AERS
2.0000 | INHALATION_SPRAY | Freq: Once | RESPIRATORY_TRACT | Status: AC
  Administered 2023-10-14: 2 via RESPIRATORY_TRACT
  Filled 2023-10-14: qty 6.7

## 2023-10-14 NOTE — Discharge Instructions (Signed)
 Your workup today does not show any evidence of a heart attack or pneumonia.  This does not rule out all heart disease and so you should still follow-up with your primary care provider.  Your Xray shows a stable lung nodule that should be followed up by your doctor.  Otherwise, use ibuprofen  and/or Tylenol  to help with pain.  You may use the albuterol  inhaler 1-2 puffs every 4 hours for cough, shortness of breath, or wheezing.  If you develop recurrent, continued, or worsening chest pain, shortness of breath, fever, vomiting, abdominal or back pain, or any other new/concerning symptoms then return to the ER for evaluation.

## 2023-10-14 NOTE — ED Triage Notes (Signed)
 C/O substernal CP and SHOB that started 3 days.  Denies n/v

## 2023-10-14 NOTE — ED Provider Notes (Signed)
 [place] EMERGENCY DEPARTMENT AT [facility] Provider Note   CSN: [id number] Arrival date & time: 10/14/23  9164     Patient presents with: Chest Pain   Leonard Clarke is a 48 y.o. male.   HPI 48 year old male presents with chest pain. Symptoms started about  days ago. Has had continuous chest tightness and pressure. Feels worse when he lays flat. Has had a non-productive cough and some dyspnea. No fevers, radiation of the pain, back or abdominal pain, or leg swelling. No recent travel or surgery or history of DVT. He is a former smoker.   Prior to Admission medications   Medication Sig Start Date End Date Taking? Authorizing Provider  albuterol  (VENTOLIN  HFA) 108 (90 Base) MCG/ACT inhaler Inhale 1-2 puffs into the lungs every 6 (six) hours as needed for wheezing or shortness of breath. 03/28/22   Small, Brooke L, PA  azithromycin  (ZITHROMAX ) 250 MG tablet Take 1 tablet (250 mg total) by mouth daily. Take first 2 tablets together, then 1 every day until finished. 03/28/22   Small, Brooke L, PA  buPROPion  (WELLBUTRIN  XL) 150 MG 24 hr tablet Take 1 tablet (150 mg total) by mouth daily. 07/02/21   Bernardo Fend, DO  hydrOXYzine  (ATARAX ) 10 MG tablet Take 1 tablet (10 mg total) by mouth as needed for anxiety. 07/02/21   Bernardo Fend, DO  lidocaine  (HM LIDOCAINE  PATCH) 4 % Place 1 patch onto the skin daily. 03/28/22   Small, Brooke L, PA    Allergies: Patient has no known allergies.    Review of Systems  Constitutional:  Negative for fever.  Respiratory:  Positive for cough, chest tightness and shortness of breath.   Cardiovascular:  Positive for chest pain. Negative for leg swelling.  Gastrointestinal:  Negative for abdominal pain.  Musculoskeletal:  Negative for back pain.    Updated Vital Signs BP 127/89 (BP Location: Right Arm)   Pulse (!) 54   Temp 98 F (36.7 C)   Resp 18   Ht 6' 1 (1.854 m)   Wt 115.7 kg   SpO2 99%   BMI 33.64 kg/m   Physical  Exam Vitals and nursing note reviewed.  Constitutional:      General: He is not in acute distress.    Appearance: He is well-developed. He is not ill-appearing or diaphoretic.  HENT:     Head: Normocephalic and atraumatic.  Cardiovascular:     Rate and Rhythm: Normal rate and regular rhythm.     Pulses:          Radial pulses are 2+ on the right side and 2+ on the left side.     Heart sounds: Normal heart sounds.  Pulmonary:     Effort: Pulmonary effort is normal.     Breath sounds: Wheezing (diffuse, mild) present.  Chest:     Chest wall: Tenderness (mild, diffuse) present.  Abdominal:     Palpations: Abdomen is soft.     Tenderness: There is no abdominal tenderness.  Musculoskeletal:     Right lower leg: No edema.     Left lower leg: No edema.  Skin:    General: Skin is warm and dry.  Neurological:     Mental Status: He is alert.     (all labs ordered are listed, but only abnormal results are displayed) Labs Reviewed  BASIC METABOLIC PANEL WITH GFR - Abnormal; Notable for the following components:      Result Value   Glucose, Bld  109 (*)    Calcium 8.8 (*)    All other components within normal limits  CBC  TROPONIN I (HIGH SENSITIVITY)  TROPONIN I (HIGH SENSITIVITY)    EKG: EKG Interpretation Date/Time:  Tuesday October 14 2023 08:45:14 EDT Ventricular Rate:  85 PR Interval:  144 QRS Duration:  88 QT Interval:  370 QTC Calculation: 440 R Axis:   76  Text Interpretation: Normal sinus rhythm no acute ST/T changes similar to Jan 2024 Confirmed by Freddi Hamilton [phone]) on 10/14/2023 11:58:35 AM  Radiology: DG Chest 2 View Result Date: 10/14/2023 CLINICAL DATA:  Chest pain. EXAM: CHEST - 2 VIEW COMPARISON:  03/28/2022. FINDINGS: Redemonstration of a smoothly marginated approximately 1.2 x 1.5 cm probably calcified opacity overlying the right lung apex, essentially similar to the prior study. Considering the stability over 1.5 years, this is favored benign, even  though incompletely characterized. Bilateral lung fields are otherwise clear. No acute consolidation or lung collapse. Bilateral costophrenic angles are clear. Normal cardio-mediastinal silhouette. No acute osseous abnormalities. The soft tissues are within normal limits. IMPRESSION: *No active cardiopulmonary disease. Redemonstration of a smoothly marginated approximately 1.2 x 1.5 cm opacity overlying the right lung apex, essentially similar to the prior study. Considering the stability over 1.5 years, this is favored benign, even though incompletely characterized. Electronically Signed   By: Ree Molt M.D.   On: 10/14/2023 09:06     Procedures   Medications Ordered in the ED  albuterol  (VENTOLIN  HFA) 108 (90 Base) MCG/ACT inhaler 2 puff (2 puffs Inhalation Given 10/14/23 1214)  acetaminophen  (TYLENOL ) tablet 650 mg (650 mg Oral Given 10/14/23 1214)                                    Medical Decision Making Amount and/or Complexity of Data Reviewed Labs: ordered.    Details: Normal troponins Radiology: ordered and independent interpretation performed.    Details: No pneumonia, stable nodule ECG/medicine tests: ordered and independent interpretation performed.    Details: No ischemia  Risk OTC drugs. Prescription drug management.   Patient presents with chest pain for 3 days.  Seems like it is chest wall.  He is also having associated cough without fever.  X-ray shows a stable nodule which she was told about but no signs of pneumonia.  There is a little bit of a pleuritic component but also reproducible and his history is more consistent with bronchitis and I have lower suspicion of PE and he is PERC negative.  I do not think further workup for PE is needed.  I have a low suspicion for ACS and dissection as well.  I did discuss that his negative workup today does not mean there is no heart disease and he will still need to follow-up with his PCP.  However I do think is safe for him to  be discharged and will treat with supportive care for the cough and chest tightness with albuterol  as he does have some mild wheezing.  I do not think antibiotics are indicated at this time.  Will discharge home with return precautions.     Final diagnoses:  Bronchitis  Chest wall pain  Lung nodule    ED Discharge Orders     None          Freddi Hamilton, MD 10/14/23 1406

## 2024-02-23 ENCOUNTER — Other Ambulatory Visit: Payer: Self-pay

## 2024-02-23 ENCOUNTER — Encounter (HOSPITAL_COMMUNITY): Payer: Self-pay

## 2024-02-23 ENCOUNTER — Emergency Department (HOSPITAL_COMMUNITY)
Admission: EM | Admit: 2024-02-23 | Discharge: 2024-02-23 | Disposition: A | Discharge status: Home / Self Care | Attending: Emergency Medicine | Admitting: Emergency Medicine

## 2024-02-23 ENCOUNTER — Emergency Department (HOSPITAL_COMMUNITY)

## 2024-02-23 DIAGNOSIS — M545 Low back pain, unspecified: Secondary | ICD-10-CM | POA: Insufficient documentation

## 2024-02-23 LAB — CBC
HCT: 42.3 % (ref 39.0–52.0)
Hemoglobin: 14.9 g/dL (ref 13.0–17.0)
MCH: 30.8 pg (ref 26.0–34.0)
MCHC: 35.2 g/dL (ref 30.0–36.0)
MCV: 87.6 fL (ref 80.0–100.0)
Platelets: 315 K/uL (ref 150–400)
RBC: 4.83 MIL/uL (ref 4.22–5.81)
RDW: 12.4 % (ref 11.5–15.5)
WBC: 11.2 K/uL — ABNORMAL HIGH (ref 4.0–10.5)
nRBC: 0 % (ref 0.0–0.2)

## 2024-02-23 LAB — URINALYSIS, ROUTINE W REFLEX MICROSCOPIC
Bilirubin Urine: NEGATIVE
Glucose, UA: NEGATIVE mg/dL
Hgb urine dipstick: NEGATIVE
Ketones, ur: 5 mg/dL — AB
Leukocytes,Ua: NEGATIVE
Nitrite: NEGATIVE
Protein, ur: NEGATIVE mg/dL
Specific Gravity, Urine: 1.021 (ref 1.005–1.030)
pH: 6 (ref 5.0–8.0)

## 2024-02-23 LAB — COMPREHENSIVE METABOLIC PANEL WITH GFR
ALT: 30 U/L (ref 0–44)
AST: 19 U/L (ref 15–41)
Albumin: 4.1 g/dL (ref 3.5–5.0)
Alkaline Phosphatase: 86 U/L (ref 38–126)
Anion gap: 8 (ref 5–15)
BUN: 19 mg/dL (ref 6–20)
CO2: 26 mmol/L (ref 22–32)
Calcium: 9.1 mg/dL (ref 8.9–10.3)
Chloride: 102 mmol/L (ref 98–111)
Creatinine, Ser: 0.73 mg/dL (ref 0.61–1.24)
GFR, Estimated: >60 mL/min (ref >60)
Glucose, Bld: 95 mg/dL (ref 70–99)
Potassium: 4.8 mmol/L (ref 3.5–5.1)
Sodium: 136 mmol/L (ref 135–145)
Total Bilirubin: 0.2 mg/dL (ref 0.0–1.2)
Total Protein: 7.5 g/dL (ref 6.5–8.1)

## 2024-02-23 LAB — LIPASE, BLOOD: Lipase: 40 U/L (ref 11–51)

## 2024-02-23 MED ORDER — METHOCARBAMOL 500 MG PO TABS
500.0000 mg | ORAL_TABLET | Freq: Once | ORAL | Status: AC
  Administered 2024-02-23: 500 mg via ORAL
  Filled 2024-02-23: qty 1

## 2024-02-23 MED ORDER — NAPROXEN 500 MG PO TABS: 500.0000 mg | ORAL_TABLET | Freq: Two times a day (BID) | ORAL | 0 refills | Status: AC

## 2024-02-23 MED ORDER — KETOROLAC TROMETHAMINE 15 MG/ML IJ SOLN
15.0000 mg | Freq: Once | INTRAMUSCULAR | Status: AC
  Administered 2024-02-23: 15 mg via INTRAVENOUS
  Filled 2024-02-23: qty 1

## 2024-02-23 MED ORDER — METHOCARBAMOL 500 MG PO TABS: 500.0000 mg | ORAL_TABLET | Freq: Two times a day (BID) | ORAL | 0 refills | Status: AC

## 2024-02-23 NOTE — ED Notes (Signed)
 First poc with patient. PT axox4. GCS 15. Pt resting in bed with spouse at bedside.  Provider at bedside.  PT c/o Left flank pain but started bilateral flank beginning 02/06/24.  No respiratory distress noted.

## 2024-02-23 NOTE — ED Triage Notes (Signed)
 Patient arrived stating he had had left sided back pain over the last three weeks, seen by PCP and advised to try cranberry juice but has had no relief. Declines any urinary symptoms.

## 2024-02-23 NOTE — ED Provider Notes (Signed)
 [place] EMERGENCY DEPARTMENT AT [facility] Provider Note   CSN: [id number] Arrival date & time: 02/23/24  1857     Patient presents with: Back Pain   Leonard Clarke is a 48 y.o. male.  48 year old male presents ED with complaints of left flank pain.  Patient reported that he started having mild pain on the 21st and they scheduled an appointment with his doctor on the 28th.  When the pain began patient was sitting in the house, pain was very mild at initial onset.  Lab work and urinalysis at that time with no significant findings and advised to try to increase fluid intake and monitor for worsening symptoms.  Patient reports sensation his left side has gotten significantly worse and he is in significant pain now.  Patient reports the pain is worse when he moves or tries to adjust in bed and getting up from a sitting position.  Patient did not have any falls and denies any injuries to the area.  Patient denies any past medical history and no daily medication.  Patient does not drink smoke or take recreational drugs.  Patient endorses associated chills at night with no reported fever.  Patient denies shortness of breath, chest pain, dizziness, headache, fever, fainting, urinary symptoms, penile/scrotal swelling.     Prior to Admission medications   Medication Sig Start Date End Date Taking? Authorizing Provider  methocarbamol  (ROBAXIN ) 500 MG tablet Take 1 tablet (500 mg total) by mouth 2 (two) times daily. 02/23/24  Yes Myriam Fonda RAMAN, PA-C  naproxen  (NAPROSYN ) 500 MG tablet Take 1 tablet (500 mg total) by mouth 2 (two) times daily. 02/23/24  Yes Myriam Fonda RAMAN, PA-C  albuterol  (VENTOLIN  HFA) 108 (90 Base) MCG/ACT inhaler Inhale 1-2 puffs into the lungs every 6 (six) hours as needed for wheezing or shortness of breath. 03/28/22   Small, Brooke L, PA  azithromycin  (ZITHROMAX ) 250 MG tablet Take 1 tablet (250 mg total) by mouth daily. Take first 2 tablets together, then 1 every day  until finished. 03/28/22   Small, Brooke L, PA  buPROPion  (WELLBUTRIN  XL) 150 MG 24 hr tablet Take 1 tablet (150 mg total) by mouth daily. 07/02/21   Bernardo Fend, DO  hydrOXYzine  (ATARAX ) 10 MG tablet Take 1 tablet (10 mg total) by mouth as needed for anxiety. 07/02/21   Bernardo Fend, DO  lidocaine  (HM LIDOCAINE  PATCH) 4 % Place 1 patch onto the skin daily. 03/28/22   Small, Brooke L, PA    Allergies: Patient has no known allergies.    Review of Systems  Constitutional:  Positive for chills.  Musculoskeletal:  Positive for back pain.  All other systems reviewed and are negative.   Updated Vital Signs BP 133/87 (BP Location: Left Arm)   Pulse 68   Temp 98 F (36.7 C) (Oral)   Resp 17   Ht 6' 1 (1.854 m)   Wt 122.5 kg   SpO2 99%   BMI 35.62 kg/m   Physical Exam Vitals and nursing note reviewed.  Constitutional:      Appearance: Normal appearance.  HENT:     Head: Normocephalic and atraumatic.     Nose: Nose normal.  Eyes:     General: No scleral icterus.    Extraocular Movements: Extraocular movements intact.     Conjunctiva/sclera: Conjunctivae normal.     Pupils: Pupils are equal, round, and reactive to light.  Cardiovascular:     Rate and Rhythm: Normal rate.  Pulmonary:  Effort: Pulmonary effort is normal. No respiratory distress.     Breath sounds: Normal breath sounds.     Comments: Lungs clear to auscultation all fields Abdominal:     General: Abdomen is flat. There is no distension.     Palpations: Abdomen is soft.     Tenderness: There is no abdominal tenderness. There is left CVA tenderness. There is no right CVA tenderness or guarding.     Comments: Patient has no pain to palpation throughout abdomen.   Left CVA tenderness.  No right CVA tenderness.  Musculoskeletal:        General: Normal range of motion.     Cervical back: Normal range of motion.     Comments: No pain to palpation down T-spine or L-spine or paraspinous pain to palpation.   Skin:    General: Skin is warm.     Capillary Refill: Capillary refill takes less than 2 seconds.  Neurological:     General: No focal deficit present.     Mental Status: He is alert.  Psychiatric:        Mood and Affect: Mood normal.        Behavior: Behavior normal.     (all labs ordered are listed, but only abnormal results are displayed) Labs Reviewed  CBC - Abnormal; Notable for the following components:      Result Value   WBC 11.2 (*)    All other components within normal limits  URINALYSIS, ROUTINE W REFLEX MICROSCOPIC - Abnormal; Notable for the following components:   Ketones, ur 5 (*)    All other components within normal limits  LIPASE, BLOOD  COMPREHENSIVE METABOLIC PANEL WITH GFR    EKG: None  Radiology: CT Renal Stone Study Result Date: 02/23/2024 EXAM: CT UROGRAM 02/23/2024 09:08:23 PM TECHNIQUE: CT of the abdomen and pelvis was performed before and after the administration of intravenous contrast as per CT urogram protocol. Multiplanar reformatted images as well as MIP urogram images are provided for review. Automated exposure control, iterative reconstruction, and/or weight based adjustment of the mA/kV was utilized to reduce the radiation dose to as low as reasonably achievable. COMPARISON: None available. CLINICAL HISTORY: Abdominal/flank pain, stone suspected Abdominal/flank pain, stone suspected FINDINGS: LOWER CHEST: No acute abnormality. LIVER: The liver is unremarkable. GALLBLADDER AND BILE DUCTS: Gallbladder is unremarkable. No biliary ductal dilatation. SPLEEN: No acute abnormality. PANCREAS: No acute abnormality. ADRENAL GLANDS: No acute abnormality. KIDNEYS, URETERS AND BLADDER: No stones in the kidneys or ureters. No hydronephrosis. No perinephric or periureteral stranding. Urinary bladder is unremarkable. GI AND BOWEL: Stomach demonstrates no acute abnormality. There is no bowel obstruction. PERITONEUM AND RETROPERITONEUM: No ascites. No free air.  VASCULATURE: Aorta is normal in caliber. LYMPH NODES: No lymphadenopathy. REPRODUCTIVE ORGANS: No acute abnormality. BONES AND SOFT TISSUES: No acute osseous abnormality. No focal soft tissue abnormality. IMPRESSION: 1. No nephrolithiasis, obstructive uropathy or urothelial mass. 2. No acute intra-abdominal or pelvic abnormality. Electronically signed by: Franky Crease MD 02/23/2024 09:18 PM EST RP Workstation: [id number]     Procedures   Medications Ordered in the ED  ketorolac  (TORADOL ) 15 MG/ML injection 15 mg (15 mg Intravenous Given 02/23/24 2044)  methocarbamol  (ROBAXIN ) tablet 500 mg (500 mg Oral Given 02/23/24 2223)    48 y.o. male presents to the ED with complaints of left lower back pain, The differential diagnosis includes but not limited to nephrolithiasis, hydronephrosis, pyelonephritis UTI, muscular strain, (Ddx)  On arrival pt is nontoxic, vitals unremarkable. Exam significant for pain to  palpation of the left flank, no redness or bruising noted  I ordered medication Toradol  and naproxen  for pain  Lab Tests:  CMP CBC lipase UA ordered in triage.  Mild leukocytosis on CBC at 11.2 no other significant findings.  Imaging Studies ordered:  I ordered imaging studies which included CT renal.  CT scan was unremarkable for any findings.  ED Course:   Patient is sitting comfortably in ED bed in no acute distress nontoxic-appearing.  Pain is exacerbated with movement and palpation.  Patient has no associated urinary symptoms and no obvious signs of injury or bruising.  CT will be ordered to rule out renal etiologies.  On initial evaluation patient did not declined any opioids but did report he will take Toradol .  CT scan was unremarkable and patient denied any relief from Toradol .  On reassessment there is palpation to his left lower flank with no other concerning signs, lungs are clear to auscultation in all fields, no injuries to the area, labs and vitals are unremarkable.  With  patient's gradual onset and exacerbation of pain with movement and relief of pain with relaxation muscular injury/strain seems likely the cause of back pain.  Patient will be started on muscle relaxer in ED, his wife is driving. After reevaluation patient reported feeling much better after the Robaxin  and he is able to move much easier.  Patient was advised of strict return precautions at bedside.  Patient was given a course of Robaxin  and naproxen  for at home.  Patient was given orthopedic follow-up and already has a PCP follow-up next week.  Patient agreed to strict return precautions and comfortable discharge at this time.   Portions of this note were generated with Scientist, clinical (histocompatibility and immunogenetics). Dictation errors may occur despite best attempts at proofreading.   Final diagnoses:  Acute left-sided low back pain without sciatica    ED Discharge Orders          Ordered    naproxen  (NAPROSYN ) 500 MG tablet  2 times daily        02/23/24 2216    methocarbamol  (ROBAXIN ) 500 MG tablet  2 times daily        02/23/24 2216               Myriam Fonda RAMAN, PA-C 02/23/24 2246    Emil Share, DO 02/23/24 2344

## 2024-02-23 NOTE — ED Notes (Signed)
 Pt axox4. GCS 15. PT and spouse of patient verbalize understanding of discharge instructions, follow up and new Rx. PT ambulated out of er with steady gait to transportation home with wife

## 2024-02-23 NOTE — Discharge Instructions (Addendum)
 CT scan and lab work were reassuring today.  I have given you naproxen  and Robaxin  to help with pain in your lower back.  I have also given you a referral to orthopedics please call them to see if possible to establish an appointment.  It is advised to also follow-up with your primary care at your established follow-up appointment.  As discussed if you experience any shortness of breath, chest pain, abdominal pain, urinary symptoms, nausea, vomiting, fevers, or other concerning symptoms please return to ED for further evaluation.
